# Patient Record
Sex: Male | Born: 1995 | Race: Black or African American | Hispanic: No | Marital: Single | Smoking: Never smoker
Health system: Southern US, Community
[De-identification: ages and names within clinical notes are randomized; demographics above are authoritative.]

## PROBLEM LIST (undated history)

## (undated) DIAGNOSIS — Z8619 Personal history of other infectious and parasitic diseases: Secondary | ICD-10-CM

## (undated) DIAGNOSIS — E0500 Thyrotoxicosis with diffuse goiter without thyrotoxic crisis or storm: Secondary | ICD-10-CM

## (undated) HISTORY — DX: Personal history of other infectious and parasitic diseases: Z86.19

---
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
## 2013-08-01 ENCOUNTER — Ambulatory Visit: Payer: Self-pay | Admitting: Family Medicine

## 2013-08-01 LAB — CBC AND DIFFERENTIAL
HCT: 41 % (ref 41–53)
HEMOGLOBIN: 14.5 g/dL (ref 13.5–17.5)
Platelets: 275 10*3/uL (ref 150–399)
WBC: 4.2 10^3/mL

## 2013-10-23 ENCOUNTER — Observation Stay: Payer: Self-pay | Admitting: Internal Medicine

## 2013-10-23 DIAGNOSIS — E0590 Thyrotoxicosis, unspecified without thyrotoxic crisis or storm: Secondary | ICD-10-CM | POA: Insufficient documentation

## 2013-10-23 LAB — T4, FREE: Free Thyroxine: >8 ng/dL — ABNORMAL HIGH (ref 0.76–1.46)

## 2013-10-23 LAB — COMPREHENSIVE METABOLIC PANEL
ALT: 62 U/L
Albumin: 3.5 g/dL (ref 3.4–5.0)
Alkaline Phosphatase: 173 U/L — ABNORMAL HIGH
Anion Gap: 10 (ref 7–16)
BUN: 9 mg/dL (ref 7–18)
Bilirubin,Total: 2.7 mg/dL — ABNORMAL HIGH (ref 0.2–1.0)
CHLORIDE: 107 mmol/L (ref 98–107)
CO2: 23 mmol/L (ref 21–32)
CREATININE: 0.56 mg/dL — AB (ref 0.60–1.30)
Calcium, Total: 9.1 mg/dL (ref 8.5–10.1)
Glucose: 82 mg/dL (ref 65–99)
Osmolality: 277 (ref 275–301)
POTASSIUM: 4.3 mmol/L (ref 3.5–5.1)
SGOT(AST): 46 U/L — ABNORMAL HIGH (ref 15–37)
SODIUM: 140 mmol/L (ref 136–145)
Total Protein: 6.4 g/dL (ref 6.4–8.2)

## 2013-10-23 LAB — URINALYSIS, COMPLETE
BACTERIA: NONE SEEN
BLOOD: NEGATIVE
Bilirubin,UR: NEGATIVE
Glucose,UR: NEGATIVE mg/dL (ref 0–75)
KETONE: NEGATIVE
Leukocyte Esterase: NEGATIVE
Nitrite: NEGATIVE
PROTEIN: NEGATIVE
Ph: 7 (ref 4.5–8.0)
RBC,UR: 1 /HPF (ref 0–5)
SPECIFIC GRAVITY: 1.01 (ref 1.003–1.030)

## 2013-10-23 LAB — CK-MB
CK-MB: 2.7 ng/mL (ref 0.5–3.6)
CK-MB: 2.5 ng/mL (ref 0.5–3.6)

## 2013-10-23 LAB — DRUG SCREEN, URINE

## 2013-10-23 LAB — CBC
HCT: 44.3 % (ref 40.0–52.0)
HGB: 14.4 g/dL (ref 13.0–18.0)
MCH: 27.1 pg (ref 26.0–34.0)
MCHC: 32.6 g/dL (ref 32.0–36.0)
MCV: 83 fL (ref 80–100)
PLATELETS: 242 10*3/uL (ref 150–440)
RBC: 5.33 10*6/uL (ref 4.40–5.90)
RDW: 13.4 % (ref 11.5–14.5)
WBC: 4.1 10*3/uL (ref 3.8–10.6)

## 2013-10-23 LAB — TSH

## 2013-10-23 LAB — TROPONIN I
TROPONIN-I: 0.12 ng/mL — AB
Troponin-I: 0.13 ng/mL — ABNORMAL HIGH

## 2013-10-24 DIAGNOSIS — I2589 Other forms of chronic ischemic heart disease: Secondary | ICD-10-CM

## 2013-10-24 DIAGNOSIS — I369 Nonrheumatic tricuspid valve disorder, unspecified: Secondary | ICD-10-CM

## 2013-10-24 LAB — LIPID PANEL
CHOLESTEROL: 77 mg/dL (ref 0–200)
HDL Cholesterol: 29 mg/dL — ABNORMAL LOW (ref 40–60)
Ldl Cholesterol, Calc: 39 mg/dL (ref 0–100)
Triglycerides: 44 mg/dL (ref 0–200)
VLDL Cholesterol, Calc: 9 mg/dL (ref 5–40)

## 2013-10-24 LAB — TROPONIN I: Troponin-I: 0.13 ng/mL — ABNORMAL HIGH

## 2013-10-24 LAB — CK-MB: CK-MB: 2 ng/mL (ref 0.5–3.6)

## 2013-10-25 LAB — CBC WITH DIFFERENTIAL/PLATELET
BASOS PCT: 0.4 %
Basophil #: 0 10*3/uL (ref 0.0–0.1)
EOS ABS: 0.2 10*3/uL (ref 0.0–0.7)
Eosinophil %: 3 %
HCT: 43.6 % (ref 40.0–52.0)
HGB: 14.3 g/dL (ref 13.0–18.0)
Lymphocyte #: 2.9 10*3/uL (ref 1.0–3.6)
Lymphocyte %: 54.7 %
MCH: 27.4 pg (ref 26.0–34.0)
MCHC: 32.8 g/dL (ref 32.0–36.0)
MCV: 84 fL (ref 80–100)
Monocyte #: 0.6 x10 3/mm (ref 0.2–1.0)
Monocyte %: 11.6 %
Neutrophil #: 1.6 10*3/uL (ref 1.4–6.5)
Neutrophil %: 30.3 %
Platelet: 239 10*3/uL (ref 150–440)
RBC: 5.23 10*6/uL (ref 4.40–5.90)
RDW: 13.6 % (ref 11.5–14.5)
WBC: 5.4 10*3/uL (ref 3.8–10.6)

## 2013-11-11 DIAGNOSIS — I428 Other cardiomyopathies: Secondary | ICD-10-CM | POA: Insufficient documentation

## 2014-05-27 NOTE — Consult Note (Signed)
PATIENT NAME:  Edwin Haney, Edwin Haney MR#:  [id number] DATE OF BIRTH:  Feb 14, 1995  DATE OF CONSULTATION:  10/24/2013  REFERRING PHYSICIAN:  Srikar R. Sudini, MD  CONSULTING PHYSICIAN:  A. Wendall Mola, MD  CHIEF COMPLAINT: Hyperthyroidism.   HISTORY OF PRESENT ILLNESS: This is a 19 year old male who was admitted on 10/23/2013 with complaints of concerns about his eyes. He reports 4 days prior to admission the left eyelid became difficult to raise, and 2 days prior to admission the right eye seemed to be protruding. This has been stable since onset. He has no prior past medical history. Initial labs showed TSH level was undetectable and free T4 was above the level of detection at greater than 8. Thyroid ultrasound showed a goiter with heterogeneous thyroid architecture and increased vascularity. The patient has been started on methimazole 10 mg every 8 hours and prednisone 100 mg daily in addition to metoprolol. He was noted to have tachycardia, and cardiology has been consulted. Echocardiogram showed reduced EF at 40%. The patient has noted a longstanding tremor, states this is unchanged. He gets very winded on prolonged exertion. Denies weight loss. Denies increased hunger. Denies neck pain. Denies heat intolerance. Denies unintentional weight loss.   PAST MEDICAL HISTORY: None.   PAST SURGICAL HISTORY: None.   SOCIAL HISTORY: The patient lives with his family. No alcohol use or drug use.   FAMILY HISTORY: Both maternal grandparents had some sort of thyroid condition, details not known. A maternal grandmother had type 2 diabetes mellitus. A paternal grandfather had bone cancer. A paternal aunt had lupus.   ALLERGIES: No known drug allergies.   OUTPATIENT MEDICATIONS: None.  CURRENT INPATIENT MEDICATIONS:  1.  Methimazole 10 mg t.i.d.  2.  Propranolol 20 mg t.i.d.  3.  Prednisone 100 mg daily.   REVIEW OF SYSTEMS:  GENERAL: No weight loss. No fever.  HEENT: Denies eye pain. Reports difficulty with  vision in left eye due to the closure of the left eyelid. Denies neck pain. Denies neck enlargement. Denies dysphagia or odynophagia.   CARDIAC: Denies palpitations. Denies chest pain.  PULMONARY: Reports dyspnea on exertion. Denies cough.  ABDOMEN: Denies abdominal pain. Denies nausea. Reports good appetite.  EXTREMITIES: Denies lower extremity swelling.  SKIN: Denies recent rash or skin changes.  ENDOCRINE: Denies heat or cold intolerance.  GENITOURINARY: Denies dysuria or hematuria.   PHYSICAL EXAMINATION:  VITAL SIGNS: Height 70 inches, weight 156 pounds, temperature 98.5, pulse 79, respirations 20, blood pressure 143/74, pulse oximetry 96% on room air.  GENERAL: Well-developed African American male in no acute distress.  HEENT: EOMI. Oropharynx is clear. Left lid lag and right eye moderate proptosis with stare is present. No periorbital edema. EOMI.  NECK: Supple. Pronounced thyromegaly. Thyroid bruit is present. No neck tenderness. No palpable thyroid nodules.  CARDIAC: Regular rate and rhythm. No murmur.  PULMONARY: Clear to auscultation bilaterally. No wheeze.  ABDOMEN: Diffusely soft, nontender, nondistended.  EXTREMITIES: No peripheral edema is present.  SKIN: Patches of hypopigmentation consistent with vitiligo present on the hands. No rash.  NEUROLOGIC: No tremor of outstretched hands. Cranial nerves intact.   LABORATORY DATA: Glucose 82, BUN 9, creatinine 0.56, sodium 140, potassium 4.3, calcium 9.1, albumin 3.5, alkaline phosphatase 173, AST 46, ALT 62, troponin I 0.13. Urine drug screen negative. Hematocrit 44.3, WBC 4.1, platelets 242,000.    ASSESSMENT: 1.  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  2.  Graves orbitopathy.  3.  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  4.  Tachycardia, resolved.   PLAN:  1.  Adjust dose of methimazole to 30 mg b.i.d.  2.  Prednisone can be somewhat helpful for Graves orbitopathy. Will adjust dose to 60 mg daily and  this is going to be needed long term, with plan for slow taper. 3.  Recommend outpatient followup with Graves eye specialist, which I can arrange.  4.  Continue propranolol for heart rate control. Current dose is adequate.  5.  Counseled patient about Graves hyperthyroidism and Graves orbitopathy, causes, natural history, and long-term treatment plan. Anticipate initial management with medications until hyperthyroidism is well controlled, and then subsequent management with either radioiodine ablation and/or surgery, to be determined at a later date.   Thank you for the kind request for consultation. From endocrine standpoint, no need for further hospitalization and I can assist with management as outpatient. Will arrange for clinic followup in 1-2 weeks.    ____________________________ A. Wendall Mola, MD ams:ST D: 10/24/2013 21:25:11 ET T: 10/24/2013 22:55:20 ET JOB#: [id number]  cc: A. Wendall Mola, MD, <Dictator> Macy Mis MD ELECTRONICALLY SIGNED 11/01/2013 8:37

## 2014-05-27 NOTE — H&P (Signed)
PATIENT NAME:  Edwin Haney, Edwin Haney MR#:  [id number] DATE OF BIRTH:  [birth year]  DATE OF ADMISSION:  10/23/2013  PRIMARY CARE PHYSICIAN: [facility].   CHIEF COMPLAINT: Swollen eyes with some blurred vision.  HISTORY OF PRESENTING ILLNESS: A 19 year old African American male patient with no past medical history who presents to the Emergency Room complaining of 2 days of swollen bilateral eyes and some drooping of the left eye. Patient mentions that the patient also has his mother accompanying him here in the Emergency Room today. They mentioned that they did not notice any change in his eyes until 2 days back. He had a normal physical examination with his primary care physician 2 months prior, with normal labs done.   Here, his TSH has been noticed to be less than 0.01 along with troponin of 0.13, and is being admitted to the hospitalist service. Patient's pulse is close to 100.   He does not complain of any swelling in his neck, trouble swallowing. No palpitations, diarrhea, weight loss. Does mention that he has been feeling a little anxious, some tremors, but has been going on with his regular life and activities.   PAST MEDICAL HISTORY: None.   SOCIAL HISTORY: The patient does not smoke. No alcohol. No illicit drugs. Presently he is in college.   FAMILY HISTORY: Bone cancer in his grandfather. Mother and father are healthy. No thyroid disease in the family.   HOME MEDICATIONS: Ibuprofen 1 tablet p.r.n. occasionally.   ALLERGIES: No known drug allergies.   REVIEW OF SYSTEMS:  CONSTITUTIONAL: Complains of no fever or weakness, complains of fatigue. EYES: Has some blurred vision, but no pain.  ENT: No tinnitus, ear pain, hearing loss.  RESPIRATORY: No cough, wheeze, hemoptysis. CARDIOVASCULAR: No chest pain, orthopnea, edema.  GASTROINTESTINAL: No nausea, vomiting, diarrhea, abdominal pain.  GENITOURINARY: No dysuria, hematuria, frequency.  ENDOCRINE: No polyuria, nocturia,  thyroid problems. HEMATOLOGY AND LYMPHATIC: No anemia, easy bruising, bleeding.  INTEGUMENTARY: No acne, rash, lesion.  MUSCULOSKELETAL: No back pain, arthritis.  NEUROLOGIC: No focal numbness, weakness, seizure.  PSYCHIATRIC: No anxiety or depression   PHYSICAL EXAMINATION: Shows:  VITAL SIGNS: Temperature 98.2, pulse of 98, blood pressure 137/79, saturating 97% on room air. GENERAL: A thin African American male patient sitting at the side of the bed, seems comfortable, conversational, seems anxious. PSYCHIATRIC: Alert and oriented x 3, anxious.  HEENT: (Dictation Anomaly). Oral mucosa moist and pink. External ears and nose normal. Has swollen eyes, exophthalmos with some conjunctival injection. Has ptosis of the left eye.  NECK: Supple. Has a diffuse swollen thyroid gland with no pain, warmth. No carotid bruit, JVD.  CARDIOVASCULAR: S1, S2, tachycardic without any murmurs. Peripheral pulses 2+. Has hyperdynamic circulation.  RESPIRATORY: Normal work of breathing. Clear to auscultation (Dictation Anomaly). GASTROINTESTINAL: Soft abdomen, nontender. Bowel sounds present. No hepatosplenomegaly palpable.  SKIN: Warm and dry. No petechiae, rash, ulcers.  MUSCULOSKELETAL: No joint tenderness in large joints. Normal muscle tone. NEUROLOGICAL: Motor strength 5/5 in upper extremities. Sensory (Dictation Anomaly).  LYMPHATIC: No cervical lymphadenopathy.   LABORATORY STUDIES: Show glucose of 82, BUN 9, creatinine 0.56, sodium 140, potassium 4.3, with GFR greater than 60. AST, ALT normal. Alkaline phosphatase of 173 with bilirubin 2.7. Troponin 0.13 with TSH of less than 0.01. Urine drug screen negative.   WBC (Dictation Anomaly), hemoglobin 14.4, platelets of 242,000.  Urinalysis shows no bacteria, 1 WBC.   EKG shows normal sinus rhythm with T-wave inversions in V3, V4 along with nonspecific ST-T wave changes.  No ST elevation found.   CT of the head without contrast shows nothing acute.    Chest x-ray, PA and lateral, showed no acute cardiopulmonary disease.   Ultrasound of the neck shows thyroid gland enlarged diffusely with increased vascularity. Ultrasound of the right upper quadrant is negative, nothing acute.   ASSESSMENT AND PLAN: 1.  Hyperthyroidism with TSH extremely low. The patient does have exophthalmos and a diffusely swollen thyroid gland. Considering his symptoms of exophthalmos, this seems to be more of a chronic condition, likely Graves disease. Ordered thyroid antibodies. Ultrasound has already been done. We will start him on propranolol along with methimazole and prednisone. Consult endocrinology for further input with the case. The patient does say he gets blurred vision, but only when he turns suddenly. Will need to closely monitor his vision at this point. Will likely need surgery in the future, and  endocrine followup. 2.  Elevated troponin, likely secondary from cardiomyopathy or his sinus tachycardia. He does not complain of any chest pain, shortness of breath. EKG does have changes, but no ST elevation found. Will get an echocardiogram along with consult cardiology for further input with this case. The patient will be started on an aspirin. Check fasting lipid profile. He is also being started on a beta blocker.  3.  Hyperbilirubinemia. The patient's right upper quadrant ultrasound is normal. This is likely secondary to his severe hyperthyroidism.  4.  Deep vein thrombosis prophylaxis with ambulation.   CODE STATUS: Full code.  TIME SPENT ON THIS CASE: 40 minutes.   ____________________________ Molinda Bailiff Zakariyah Freimark, MD srs:ST D: 10/23/2013 21:18:29 ET T: 10/23/2013 21:35:41 ET JOB#: [id number]  cc: Wardell Heath R. Emalie Mcwethy, MD, <Dictator> [facility] A. Wendall Mola, MD Wardell Heath [place] MD ELECTRONICALLY SIGNED 10/26/2013 16:30

## 2014-05-27 NOTE — Discharge Summary (Signed)
Dates of Admission and Diagnosis:  Date of Admission 23-Oct-2013   Date of Discharge 25-Oct-2013   Admitting Diagnosis Hyperthyroidism with TSH extremely low   Final Diagnosis 1.  Hyperthyroidism due to graves dz and orbitopathy 2.  Elevated troponin: due to supply demand ischemia from tachycardia due to graves  3.  Hyperbilirubinemia: likely secondary to his severe hyperthyroidism    Chief Complaint/History of Present Illness Swollen eyes with some blurred vision   Allergies:  No Known Allergies:   Pertinent Past History:  Pertinent Past History none   Hospital Course:  Hospital Course patient is a 19 year old male with no significant medical problem was admitted for hyperthyroidism with graves ophthalmopathy. he was started on methimazole prednisone and propranolol. Recently Dr. Eddie North dictated history and physical for further details. endocrine consultation was obtained with Dr. Tedd Sias. She agreed with above management and recommended outpatient followup.patient was feeling much better and was discharged home in stable condition. He was agreeable to discharge plans.   Condition on Discharge Good   Code Status:  Code Status Full Code   PHYSICAL EXAM ON DISCHARGE:  Physical Exam:  GEN no acute distress   HEENT pink conjunctivae   NECK supple  No masses   RESP normal resp effort  clear BS   CARD regular rate  no murmur   VASCULAR ACCESS -- Purulent drainage   ABD denies tenderness  soft   EXTR negative edema   SKIN No ulcers   NEURO cranial nerves intact, follows commands, motor/sensory function intact   PSYCH alert, A+O to time, place, person   DISCHARGE INSTRUCTIONS HOME MEDS:  Medication Reconciliation: Patient's Home Medications at Discharge:     Medication Instructions  proventil hfa cfc free 90 mcg/inh inhalation aerosol  1 puff(s) inhaled 4 times a day, As Needed - for Shortness of Breath, for Wheezing    prednisone 20 mg oral tablet  3 tab(s)  orally once a day x 21 days   methimazole 10 mg oral tablet  3 tab(s) orally 2 times a day x 30 days   propranolol 20 mg oral tablet  1 tab(s) orally 3 times a day    PRESCRIPTIONS: ELECTRONICALLY SUBMITTED   Physician's Instructions:  Diet Low Sodium  Low Fat, Low Cholesterol   Activity Limitations As tolerated   Return to Work Not Applicable   Time frame for Follow Up Appointment 1-2 weeks  dr Tobi Bastos solum   Time frame for Follow Up Appointment 2-4 weeks  [facility]     Kirke Corin, Muhammad(Consultant): [facility], [address], [address], [place], [place] [address], [place] [phone]   Carlena Sax M(Consultant): [facility], [address], [place], [place] [address], [place] [phone]   Miscellaneous, Doctor(Family Physician): [address] [place], [place], [place] [address], [place] [phone]  TIME SPENT:  Total Time: Greater than 30 minutes   Electronic Signatures: Patricia Pesa (MD)  (Signed 23-Sep-15 11:44)  Authored: ADMISSION DATE AND DIAGNOSIS, CHIEF COMPLAINT/HPI, Allergies, PERTINENT PAST HISTORY, HOSPITAL COURSE, PHYSICAL EXAM ON DISCHARGE, DISCHARGE INSTRUCTIONS HOME MEDS, PATIENT INSTRUCTIONS, Follow Up Physician, TIME SPENT   Last Updated: 23-Sep-15 11:44 by Patricia Pesa (MD)

## 2014-05-27 NOTE — Consult Note (Signed)
General Aspect Primary MD: [facility] Primary Cardiologist: New to [facility]  19 year old male with no known prior PMH who presented to [facility] 10/23/13 with bilateral swollen eyes and drooping of the left eye.  __________________________________  Past Medical History: 1. None __________________________________   Present Illness 19 year old male with no known prior PMH who presented to [facility] 10/23/13 with bilateral swollen eyes and drooping of the left eye x 2 days. Patient notes increased fatigue, increased sweating, tremors, and palpitations with exertion over the past 6 months, however it was the swollen right eye and left eye drooping that brought him into the hospital. He denies any chest pain through out all of the above. Prior to his arrival to the hospital he applied alcohol to the left eye thinking it was a bug bite. He denies any changes to his bowels. He recently had a full CPE with his PCP about 2 months ago that was reportedly normal with normal labs. He has been going on with his regular life throughout the above.    Upon arrival to [facility] ED he was found he was found to be in NSR with a pulse close to 100. TSH was found to be <0.010 with a free T4 of >8. Troponin 0.13 that has further plateaued to 0.12-->0.13. EKG NSR, 72, TWI V3, V4, nonspecific st/t changes. Echo has been performed and is pending. He was admitted to the hospitalist service and started on methimazole, propranolol, and prednisone. Endocrinology consult is pending. We are consulted for further evaluation.   Physical Exam:  GEN well developed, well nourished, no acute distress   HEENT PERRL, hearing intact to voice   NECK supple  bruit left side   RESP normal resp effort  clear BS   CARD Regular rate and rhythm  No murmur   ABD denies tenderness  rigid  normal BS   EXTR negative edema   SKIN normal to palpation   NEURO cranial nerves intact   PSYCH alert, A+O to time, place, person, good insight    Review of Systems:  General: Fatigue   ENT: No Complaints   Eyes: Vision difficulty  exophthalmos and swollen eye   Neck: No Complaints   Respiratory: No Complaints   Cardiovascular: Palpitations   Gastrointestinal: No Complaints   Genitourinary: No Complaints   Vascular: No Complaints   Musculoskeletal: No Complaints   Neurologic: tremors   Psychiatric: Nervousness  Anxiety   Review of Systems: All other systems were reviewed and found to be negative   Medications/Allergies Reviewed Medications/Allergies reviewed   Family & Social History:  Family and Social History:  Family History father with bone cancer. grandmother & aunt with thyroid issues (unclear if hypo or hyper)   Social History negative tobacco, negative ETOH, negative Illicit drugs   Place of Living Home  lives in [place]     denies:    None, patient reports no surgical history.:   Home Medications: Medication Instructions Status  Proventil HFA CFC free 90 mcg/inh inhalation aerosol 1 puff(s) inhaled 4 times a day, As Needed - for Shortness of Breath, for Wheezing  Active   Lab Results:  Thyroid:  20-Sep-15 15:16   Thyroid Stimulating Hormone  < 0.010 (0.45-4.50 (IU = International Unit)  ----------------------- Pregnant patients have  different reference  ranges for TSH:  - - - - - - - - - -  Pregnant, first trimetser:  0.36 - 2.50 uIU/mL)    19:18   Thyroxine, Free  >  8.00 (Result(s) reported on 23 Oct 2013 at 10:25PM.)  Hepatic:  20-Sep-15 15:16   Bilirubin, Total  2.7  Alkaline Phosphatase  173 (46-116 NOTE: New Reference Range 08/23/13)  SGPT (ALT) 62 (14-63 NOTE: New Reference Range 08/23/13)  SGOT (AST)  46  Total Protein, Serum 6.4  Albumin, Serum 3.5  General Ref:  20-Sep-15 15:16   T3 Uptake ========== TEST NAME ==========  ========= RESULTS =========  = REFERENCE RANGE =  T3 UPTAKE  T3 Uptake T3 Uptake                       [H  50 %                 ]              24-39 Free Thyroxine Index            [H  15.6                 ]     1.2-4.9               [facility]            No: [id number]           [address], [place], [place] [address]           Lindon Romp, MD         [phone]   Result(s) reported on 24 Oct 2013 at 05:47AM.  Thyroxine (T4) ========== TEST NAME ==========  ========= RESULTS =========  = REFERENCE RANGE =  THYROXINE(T4)  Thyroxine (T4) Thyroxine (T4)                  [H  31.2 ug/dL           ]          4.5-12.0 Results confirmed on dilution.               [facility]            No: [id number]           [address] [place], [place] [address]           Lindon Romp, MD         [phone]   Result(s) reported on 24 Oct 2013 at 05:47AM.  Routine Chem:  20-Sep-15 15:16   Result Comment TROPONIN - RESULTS VERIFIED BY REPEAT TESTING.  - LAB/ERIC KOMAR-PA AT 1817 10/23/13  - READ-BACK PROCESS PERFORMED.  Result(s) reported on 23 Oct 2013 at 06:20PM.  Glucose, Serum 82  BUN 9  Creatinine (comp)  0.56  Sodium, Serum 140  Potassium, Serum 4.3  Chloride, Serum 107  CO2, Serum 23  Calcium (Total), Serum 9.1  Osmolality (calc) 277  eGFR (African American) >60  eGFR (Non-African American) >60 (eGFR values <61m/min/1.73 m2 may be an indication of chronic kidney disease (CKD). Calculated eGFR is useful in patients with stable renal function. The eGFR calculation will not be reliable in acutely ill patients when serum creatinine is changing rapidly. It is not useful in  patients on dialysis. The eGFR calculation may not be applicable to patients at the low and high extremes of body sizes, pregnant women, and vegetarians.)  Anion Gap 10  Urine Drugs:  [id number]  Tricyclic Antidepressant, Ur Qual (comp) NEGATIVE (Result(s) reported on 23 Oct 2013 at 03:43PM.)  Amphetamines, Urine Qual. NEGATIVE  MDMA, Urine Qual. NEGATIVE  Cocaine Metabolite, Urine Qual. NEGATIVE   Opiate, Urine qual NEGATIVE  Phencyclidine, Urine Qual. NEGATIVE  Cannabinoid, Urine Qual. NEGATIVE  Barbiturates, Urine Qual. NEGATIVE  Benzodiazepine, Urine Qual. NEGATIVE (----------------- The URINE DRUG SCREEN provides only a preliminary, unconfirmed analytical test result and should not be used for non-medical  purposes.  Clinical consideration and professional judgment should be  applied to any positive drug screen result due to possible interfering substances.  A more specific alternate chemical method must be used in order to obtain a confirmed analytical result.  Gas chromatography/mass spectrometry (GC/MS) is the preferred confirmatory method.)  Methadone, Urine Qual. NEGATIVE  Cardiac:  20-Sep-15 15:16   CPK-MB, Serum 2.7 (Result(s) reported on 23 Oct 2013 at 11:22PM.)  Troponin I  0.13 (0.00-0.05 0.05 ng/mL or less: NEGATIVE  Repeat testing in 3-6 hrs  if clinically indicated. >0.05 ng/mL: POTENTIAL  MYOCARDIAL INJURY. Repeat  testing in 3-6 hrs if  clinically indicated. NOTE: An increase or decrease  of 30% or more on serial  testing suggests a  clinically important change)    19:18   Troponin I  0.12 (0.00-0.05 0.05 ng/mL or less: NEGATIVE  Repeat testing in 3-6 hrs  if clinically indicated. >0.05 ng/mL: POTENTIAL  MYOCARDIAL INJURY. Repeat  testing in 3-6 hrs if  clinically indicated. NOTE: An increase or decrease  of 30% or more on serial  testing suggests a  clinically important change)  21-Sep-15 23:55   Troponin I  0.13 (0.00-0.05 0.05 ng/mL or less: NEGATIVE  Repeat testing in 3-6 hrs  if clinically indicated. >0.05 ng/mL: POTENTIAL  MYOCARDIAL INJURY. Repeat  testing in 3-6 hrs if  clinically indicated. NOTE: An increase or decrease  of 30% or more on serial  testing suggests a  clinically important change)  Routine UA:  20-Sep-15 15:16   Color (UA) Yellow  Clarity (UA) Hazy  Glucose (UA) Negative  Bilirubin (UA) Negative  Ketones  (UA) Negative  Specific Gravity (UA) 1.010  Blood (UA) Negative  pH (UA) 7.0  Protein (UA) Negative  Nitrite (UA) Negative  Leukocyte Esterase (UA) Negative (Result(s) reported on 23 Oct 2013 at 03:51PM.)  RBC (UA) 1 /HPF  WBC (UA) 1 /HPF  Bacteria (UA) NONE SEEN  Epithelial Cells (UA) <1 /HPF  Mucous (UA) PRESENT (Result(s) reported on 23 Oct 2013 at 03:51PM.)  Routine Hem:  20-Sep-15 15:16   WBC (CBC) 4.1  RBC (CBC) 5.33  Hemoglobin (CBC) 14.4  Hematocrit (CBC) 44.3  Platelet Count (CBC) 242 (Result(s) reported on 23 Oct 2013 at 03:28PM.)  MCV 83  MCH 27.1  MCHC 32.6  RDW 13.4   EKG:  EKG Interp. by me   Interpretation NSR, 72, TWI V3, V4, nonspecific st/t changes   Radiology Results: [place]:    20-Sep-15 17:32, US Soft Tissue Head/Neck/Thyroid  US Soft Tissue Head/Neck/Thyroid   REASON FOR EXAM:    neck swelling  COMMENTS:       PROCEDURE: [place]  - US SOFT TISSUE HEAD/NECK/THYROID  - Oct 23 2013  5:32PM     CLINICAL DATA:  Neck swelling.    EXAM:  THYROID ULTRASOUND    TECHNIQUE:  Ultrasound examination of the thyroid gland andadjacent soft  tissues was performed.    COMPARISON:  None.  FINDINGS:  Right thyroid lobe    Measurements: 7 cm x 2.6 cm x 2.9 cm. No nodules. Gland is diffusely  heterogeneous and demonstrates diffuse increased vascularity.    Left thyroid lobe    Measurements: 6.8 cm  x 2.4 cm x 3.2 cm. No nodules. Gland is  diffusely heterogeneous and demonstrates diffuse increased  vascularity.    Isthmus    Thickness: 9.2 mm.  No nodules visualized.  Lymphadenopathy    None visualized.     IMPRESSION:  1. Thyroid gland is enlarged, diffusely heterogeneous echogenicity  and shows diffuse increased vascularity. This supports thyroiditis  in the proper clinical setting.  2. No discrete thyroid nodule.      Electronically Signed    By: Lajean Manes M.D.    On: 10/23/2013 17:38     Verified By: Lasandra Beech, M.D.,    20-Sep-15  20:56, US Abdomen Limited Survey  US Abdomen Limited Survey   REASON FOR EXAM:    Hyperbilirubinemia  COMMENTS:   Body Site: Gallbladder, Liver, Common Bile Duct    PROCEDURE: [place]  - US ABDOMEN LIMITED SURVEY  - Oct 23 2013  8:56PM     CLINICAL DATA:  Hyper bilirubinemia. Initial encounter.    EXAM:  US ABDOMEN LIMITED - RIGHT UPPER QUADRANT    COMPARISON:  05/13/2013    FINDINGS:  Gallbladder:  No gallstones or wall thickening visualized. No sonographic Murphy  sign noted.    Common bile duct:    Diameter: 3 mm.  Where visualized, no filling defect.    Liver:    No focal lesion identified. Within normal limits in parenchymal  echogenicity. Antegrade flow in the imaged portal venous system.     IMPRESSION:  Negative right upper quadrant ultrasound.  Electronically Signed    By: Jorje Guild M.D.    On: 10/23/2013 21:01         Verified By: Gilford Silvius, M.D.,    No Known Allergies:   Vital Signs/Nurse's Notes: **Vital Signs.:   21-Sep-15 08:39  Vital Signs Type Routine  Temperature Temperature (F) 97.8  Celsius 36.5  Temperature Source oral  Pulse Pulse 100  Respirations Respirations 20  Systolic BP Systolic BP 865  Diastolic BP (mmHg) Diastolic BP (mmHg) 73  Mean BP 94  Pulse Ox % Pulse Ox % 96  Pulse Ox Activity Level  At rest  Oxygen Delivery Room Air/ 21 %    Impression 19 year old male with no known prior PMH who presented to [facility] 10/23/13 with bilateral swollen eyes and drooping of the left eye who was found to have a TSH of <0.010, free T4 >8, an elevated troponin of 0.13-->0.12-->0.13, and sinus tachycardia.  1. Demand ischemia: -Troponin levels have plateaued -Unlikely ischemic as he does not have any chest pain or specific EKG changes -Echo pending - may reveal dilated cardiomyapthy 2/2 hyperthyroidism - tx is key  2. Hyperthyroidism: -Has been started on methimazole, propranolol, and prednisone. Endocrinology consult and further labs  pending. -Appears to be chronic condition  3. Hyperbilirubinemia: -Per IM   Electronic Signatures for Addendum Section:  Kathlyn Sacramento (MD) (Signed Addendum 21-Sep-15 13:33)  The patient was seen and examined. Agree with the above. He presented with left eye droop and exophthalamus with dyspnea with minimal activities. He was found to have severe hyperthyroidism. Mildly elevated TnI. exam shows hyperdynamic apex but no murmurs.  echos showed normal EF with mildly thickened mitral valve, mild TR and mild pulmonary hypertension.  Agree with propranolol and treatment of hyperthyroidim. Elevated TnI is likely due to supply demand ischemia.   Electronic Signatures: Kathlyn Sacramento (MD)  (Signed 21-Sep-15 13:33)  Co-Signer: General Aspect/Present Illness, Home Medications, Allergies, Impression/Plan Idolina Primer, Brenlyn Beshara M (PA-C)  (  Signed 21-Sep-15 10:35)  Authored: General Aspect/Present Illness, History and Physical Exam, Review of System, Family & Social History, Past Medical History, Home Medications, Labs, EKG , Radiology, Allergies, Vital Signs/Nurse's Notes, Impression/Plan   Last Updated: 21-Sep-15 13:33 by Kathlyn Sacramento (MD)

## 2014-05-28 NOTE — H&P (Signed)
PATIENT NAME:  Edwin Haney, Edwin Haney MR#:  [id number] DATE OF BIRTH:  Dec 22, 1995  DATE OF ADMISSION:  05/14/2011  PRIMARY MD: [facility]   ED REFERRING PHYSICIAN: Dr. Olivia Mackie    CHIEF COMPLAINT: Abdominal pain, diffuse.   HISTORY OF PRESENT ILLNESS: The patient is an 19 year old African American male with no past medical history who reports that he started having generalized abdominal pain which he describes as "my stomach is about to explode" since 2 a.m. this morning. Due to these symptoms, he came to the ED. He reports that it's a sharp type of pain. Sometimes it's cramping. It comes and goes. He is very hungry right now and wants to eat. He has waiting a few hours in the ED and is getting anxious about not eating. The patient in the ER was noted to have abnormal blood work including a creatinine that was elevated at 2.39. He was also noticed to have elevated bilirubin and slightly elevated. LFTs. His urinalysis also showed 1+ blood and 100 protein. We are asked to admit the patient. The patient reports that he was nauseous earlier and threw up his lunch but has not had any further episodes of vomiting. No diarrhea. He does report that he was working out yesterday as part of his track meet and did not get to drink enough water. He, otherwise, denies any fevers, chills. No hematemesis. No hematochezia. Denies any urinary frequency, urgency, or burning. Denies any flank pain or groin pain.   PAST MEDICAL HISTORY: None.   PAST SURGICAL HISTORY: None.   ALLERGIES: None.   MEDICATIONS: None.   SOCIAL HISTORY: Does not smoke. Does not drink. No drugs.   FAMILY HISTORY: His grandfather had liver cirrhosis but no other significant medical history.   REVIEW OF SYSTEMS: CONSTITUTIONAL: Denies any fevers. Complains of fatigue, weakness, abdominal pain. No weight loss. No weight gain. EYES: No blurred or double vision. No pain. No redness. No inflammation. No glaucoma. ENT: No tinnitus or ear  pain. No hearing loss. No allergies. No nasal discharge. No snoring. No postnasal drip. RESPIRATORY: No cough. No wheezing. No hemoptysis. No dyspnea. No COPD. No TB. CARDIOVASCULAR: No chest pain. No orthopnea. No edema. No arrhythmia. GI: Complains of nausea and one episode of vomiting. No diarrhea. Abdominal pain as above. No hematemesis. No melena. No ulcer. No gastroesophageal reflux disease. No IBS. No jaundice. No rectal bleeding. No changes in bowels. GU: Denies any dysuria, hematuria, renal calculus, or frequency. ENDOCRINE: Denies any polyuria, nocturia, or thyroid problems. HEME/LYMPH: Denies anemia, easy bruisability, or bleeding. SKIN: No acne. No rash. No changes in mole, hair or skin. MUSCULOSKELETAL: Denies any pain in neck, back, or shoulder. NEUROLOGIC: No numbness. No CVA. No TIA. PSYCHIATRIC: No anxiety. No insomnia. No ADD. No OCD. No bipolar.   PHYSICAL EXAMINATION:   VITAL SIGNS: Temperature 98.1, pulse 48, respirations 18, blood pressure 135/72, O2 98% on room air.   GENERAL: The patient is a well developed, well nourished [place] American male in no acute distress.   HEENT: Head atraumatic, normocephalic. Pupils equally round, reactive to light and accommodation. Extraocular movements intact. There is no conjunctival pallor. No scleral icterus. Oropharynx is a little dry. There are no exudates. Ear exam shows no external erythema or drainage. Nasal exam shows no drainage or ulceration.   NECK: No carotid bruits. No thyromegaly.   CARDIOVASCULAR: Regular rate and rhythm. No murmurs, rubs, clicks, or gallops. PMI is not displaced.   LUNGS: Clear to auscultation bilaterally  without any rales, rhonchi, or wheezing.   ABDOMEN: Mild epigastric tenderness. No guarding or rebound. No hepatosplenomegaly.   EXTREMITIES: No clubbing, cyanosis, or edema.   SKIN: No rash.   LYMPHATICS: No lymph nodes palpable.   MUSCULOSKELETAL: There is no erythema or swelling.   VASCULAR: Good  DP, PT pulses.   PSYCHIATRIC: Not anxious or depressed.   EVALUATIONS IN THE ED: Ultrasound of the abdomen shows no gallstones. No other acute changes. Kidneys appear mildly hyperechogenic, may be of technical origin.   WBC 8.4, hemoglobin 16.4, platelet count 208, glucose 87, BUN 15, creatinine 2.39, sodium 140, potassium 4.0, chloride 107, CO2 25. LFTs showed a bilirubin total of 2.8, alkaline phosphatase 85, ALT 28, AST 62, total protein 7.8, albumin 4.7, lipase 66. Urinalysis shows 1+ blood, nitrites negative, protein 100.   ASSESSMENT AND PLAN: The patient is an 19 year old African American male who presents with abdominal pain since this morning. He came to the ED and had a general ultrasound of the abdomen which is noted, noted to have elevated creatinine, elevated bilirubin. 1. Abdominal pain possibly due to gastritis, however, there are other things like appendicitis that needs to be ruled out. At this time will get a CT scan of the abdomen with p.o. contrast. Will have GI evaluate the patient. Place him on PPIs b.i.d.  2. Elevated creatinine with abnormal urinalysis suggesting possible renal disease. There is no previous BMP available. I called his primary care MD's office. It could be related to his track meeting yesterday with heavy exertion. At this time will give him IV fluids. Will check urine creatinine and protein. Will ask Nephrology for further evaluation.  3. Abnormal LFTs. Will check a hepatitis panel. His abdominal ultrasound shows no gallbladder disease.   TIME SPENT: 35 minutes.   ____________________________ Lacie Scotts Allena Katz, MD shp:drc D: 05/14/2011 16:35:35 ET T: 05/14/2011 17:07:15 ET JOB#: [id number]  cc: Chinaza Rooke H. Allena Katz, MD, <Dictator>, [facility] Charise Carwin MD ELECTRONICALLY SIGNED 05/17/2011 16:33

## 2014-05-28 NOTE — Consult Note (Signed)
PATIENT NAME:  Edwin Haney, BUB MR#:  [id number] DATE OF BIRTH:  [birth year]  DATE OF CONSULTATION:  05/15/2011  REFERRING PHYSICIAN:   CONSULTING PHYSICIAN:  Lurline Del, MD  REASON FOR CONSULTATION: Abdominal pain.   HISTORY OF PRESENT ILLNESS: The patient is an 19 year old African American male without significant past medical history. He was admitted through the Emergency Room by hospitalist service after he presented with severe abdominal pain mainly in the epigastric region. He vomited once. He was noted to have a high creatinine as well as high bilirubin of 2.5. The patient has no prior history of liver or kidney disease. The patient was involved in severe exertion on track prior to arrival and apparently did not hydrate himself very well according to him. CPK was elevated to about 1900 and patient was admitted with a diagnosis of rhabdomyolysis with renal insufficiency as well as abdominal pain, vomiting, and abnormal liver enzymes. As of today, the patient denies any abdominal pain. He has been tolerating a regular diet well. He has had no further nausea, vomiting, or diarrhea.   PAST MEDICAL HISTORY: None.   PAST SURGICAL HISTORY: None.   MEDICATIONS: None.   ALLERGIES: None.   SOCIAL HISTORY: Does not smoke or drink.   FAMILY HISTORY: Unremarkable.   REVIEW OF SYSTEMS: Negative except for what is mentioned in the history of present illness.   PHYSICAL EXAMINATION:   GENERAL: Well built male who does not appear to be in any acute distress.   VITAL SIGNS: Temperature 95.2, pulse 50, respirations 18 to 20, blood pressure 127/74.   HEENT: Unremarkable. Clinically he does not appear to be jaundiced.   NECK: Neck veins are flat.   LUNGS: Clear to auscultation bilaterally with fair air entry and no added sounds.   CARDIOVASCULAR: Regular rate and rhythm. No gallops or murmur.   ABDOMEN: Soft and benign, nontender, nondistended. No rebound or guarding was noted.    NEUROLOGIC: Appears to be unremarkable.   LABORATORY, DIAGNOSTIC, AND RADIOLOGICAL DATA: CBC is within normal limits. CPK was 1900 on admission; is 841 today. Lipase is normal at 66. Liver enzymes are normal except for bilirubin which is somewhat elevated at 2.8 and AST of 62. CT scan of the abdomen and pelvis as well as ultrasound is unremarkable.   ASSESSMENT AND PLAN: The patient is with acute abdominal pain most likely secondary to rhabdomyolysis. The patient's abdominal pain has resolved and CT and ultrasound are unremarkable. The patient also had elevated bilirubin with somewhat elevated AST. This is also consistent with rhabdomyolysis and dehydration. Underlying Guillain-Barre syndrome with further worsening of bilirubin secondary to dehydration is another possibility. No signs of active liver disease and CT and ultrasound are fairly unremarkable.   RECOMMENDATIONS:  1. I would recommend repeating LFTs tomorrow morning.  2. Agree with regular diet.  3. If the patient's bilirubin improves and he continues to be asymptomatic, no further GI work-up will be done.  4. Will follow.   ____________________________ Lurline Del, MD si:drc D: 05/15/2011 18:14:00 ET T: 05/16/2011 08:36:28 ET JOB#: [id number]  cc: Lurline Del, MD, <Dictator> Lurline Del MD ELECTRONICALLY SIGNED 05/16/2011 16:34

## 2014-05-28 NOTE — Discharge Summary (Signed)
PATIENT NAME:  Edwin Haney, Edwin Haney MR#:  [id number] DATE OF BIRTH:  09-16-1995  DATE OF ADMISSION:  05/14/2011 DATE OF DISCHARGE:  05/16/2011  PRESENTING COMPLAINT:  Abdominal pain and vomiting.  DISCHARGE DIAGNOSES:  1. Acute rhabdomyolysis.  2. Severe dehydration.  3. Acute renal failure.   CONDITION ON DISCHARGE: Fair.   MEDICATIONS: Claritin 10 mg p.o. daily.   CONSULTATIONS: Dr. Thedore Mins, nephrology.   FOLLOWUP:  Follow up with Dr. Wynelle Link on 05/19/2011 at 11:00 a.m.   LABS AT DISCHARGE:  CK total 531, creatinine 1.8, BUN 12, sodium 146, potassium 3.5, chloride 107, bicarbonate is 29, calcium is 8.1. Urine creatinine 106.2.  Random urine protein is 13. Urinalysis negative for urinary tract infection. CBC within normal limits. Creatinine on admission was 2.4.   BRIEF SUMMARY OF HOSPITAL COURSE:  Edwin Haney is an 19 year old African American male with no medical history who comes to the Emergency Room with:  1. Acute rhabdomyolysis suspected from not hydrating well after a track meet and dehydration. The patient received IV fluids with normal saline and bicarbonate drip, which was ordered by Dr. Thedore Mins. Urinalysis suggestive of rhabdomyolysis. Creatinine was 2.4, which is down to 1.8 after IV hydration. The patient was hydrating well and appeared euvolemic at discharge.  2. Abdominal pain from vomiting and dehydration, improved. Tolerating regular food.  3. Acute renal failure from dehydration due to rhabdomyolysis. No baseline labs were found. The patient's creatinine trended down. He will follow up with Dr. Wynelle Link from nephrology as an outpatient to ensure normalizing of creatinine.  4. Hospital stay otherwise remained stable. The patient remained a FULL CODE.    Discharge plan was discussed with the patient's parents.   TIME SPENT: 40 minutes.   ____________________________ Wylie Hail Allena Katz, MD sap:bjt D: 05/16/2011 16:36:05 ET T: 05/19/2011 09:16:25 ET JOB#: [id number]  cc: Suellyn Meenan A. Allena Katz,  MD, <Dictator> Laverda Sorenson, MD Willow Ora MD ELECTRONICALLY SIGNED 05/26/2011 13:20

## 2014-08-02 ENCOUNTER — Other Ambulatory Visit: Payer: Self-pay | Admitting: Internal Medicine

## 2014-08-02 DIAGNOSIS — E0500 Thyrotoxicosis with diffuse goiter without thyrotoxic crisis or storm: Secondary | ICD-10-CM

## 2014-08-31 ENCOUNTER — Encounter
Admission: RE | Admit: 2014-08-31 | Discharge: 2014-08-31 | Disposition: A | Discharge status: Home / Self Care | Payer: PRIVATE HEALTH INSURANCE | Source: Ambulatory Visit | Attending: Internal Medicine | Admitting: Internal Medicine

## 2014-08-31 DIAGNOSIS — E0500 Thyrotoxicosis with diffuse goiter without thyrotoxic crisis or storm: Secondary | ICD-10-CM | POA: Insufficient documentation

## 2014-08-31 HISTORY — DX: Thyrotoxicosis with diffuse goiter without thyrotoxic crisis or storm: E05.00

## 2014-08-31 MED ORDER — SODIUM IODIDE I-123 7.4 MBQ PO CAPS
159.1890 | ORAL_CAPSULE | Freq: Once | ORAL | Status: AC
  Administered 2014-08-31: 159.189 via ORAL

## 2014-09-01 ENCOUNTER — Encounter
Admission: RE | Admit: 2014-09-01 | Discharge: 2014-09-01 | Disposition: A | Discharge status: Home / Self Care | Payer: PRIVATE HEALTH INSURANCE | Source: Ambulatory Visit | Attending: Internal Medicine | Admitting: Internal Medicine

## 2014-09-01 DIAGNOSIS — E0500 Thyrotoxicosis with diffuse goiter without thyrotoxic crisis or storm: Secondary | ICD-10-CM | POA: Diagnosis not present

## 2014-09-07 ENCOUNTER — Ambulatory Visit
Admission: RE | Admit: 2014-09-07 | Discharge: 2014-09-07 | Disposition: A | Discharge status: Home / Self Care | Payer: PRIVATE HEALTH INSURANCE | Source: Ambulatory Visit | Attending: Internal Medicine | Admitting: Internal Medicine

## 2014-09-07 DIAGNOSIS — E0500 Thyrotoxicosis with diffuse goiter without thyrotoxic crisis or storm: Secondary | ICD-10-CM | POA: Diagnosis present

## 2014-09-07 MED ORDER — SODIUM IODIDE I 131 CAPSULE
18.9200 | Freq: Once | INTRAVENOUS | Status: AC | PRN
  Administered 2014-09-07: 18.92 via ORAL

## 2015-04-30 ENCOUNTER — Encounter: Payer: Self-pay | Admitting: Medical Oncology

## 2015-04-30 ENCOUNTER — Emergency Department
Admission: EM | Admit: 2015-04-30 | Discharge: 2015-04-30 | Disposition: A | Discharge status: Home / Self Care | Payer: PRIVATE HEALTH INSURANCE | Attending: Emergency Medicine | Admitting: Emergency Medicine

## 2015-04-30 ENCOUNTER — Emergency Department
Admission: EM | Admit: 2015-04-30 | Discharge: 2015-04-30 | Disposition: A | Discharge status: Home Health | Payer: PRIVATE HEALTH INSURANCE

## 2015-04-30 DIAGNOSIS — J029 Acute pharyngitis, unspecified: Secondary | ICD-10-CM | POA: Diagnosis present

## 2015-04-30 DIAGNOSIS — E0500 Thyrotoxicosis with diffuse goiter without thyrotoxic crisis or storm: Secondary | ICD-10-CM | POA: Diagnosis not present

## 2015-04-30 DIAGNOSIS — J020 Streptococcal pharyngitis: Secondary | ICD-10-CM | POA: Diagnosis not present

## 2015-04-30 LAB — POCT RAPID STREP A: Streptococcus, Group A Screen (Direct): POSITIVE — AB

## 2015-04-30 MED ORDER — AMOXICILLIN 500 MG PO CAPS: 500.0000 mg | ORAL_CAPSULE | Freq: Three times a day (TID) | ORAL | Status: DC

## 2015-04-30 MED ORDER — AMOXICILLIN 500 MG PO CAPS
500.0000 mg | ORAL_CAPSULE | Freq: Once | ORAL | Status: AC
  Administered 2015-04-30: 500 mg via ORAL
  Filled 2015-04-30: qty 1

## 2015-04-30 NOTE — Discharge Instructions (Signed)
Tylenol or ibuprofen as needed for fever and body aches. Take amoxicillin 500 mg 3 times a day for 10 days. Do not stop taking the medication until completely finished. Drink fluids if he did not feel like eating. Follow-up with Dr. Sherrie Mustache if any continued problems.

## 2015-04-30 NOTE — ED Notes (Signed)
Pt reports sore throat x 3 days.  

## 2015-04-30 NOTE — ED Provider Notes (Signed)
[facility] Emergency Department Provider Note  ____________________________________________  Time seen: Approximately 7:27 AM  I have reviewed the triage vital signs and the nursing notes.   HISTORY  Chief Complaint Sore Throat    HPI Edwin Haney is a 20 y.o. male is here with complaint of sore throat 3 days.Patient was uncertain that he was running fever. He has not taken any Tylenol or Motrin today but did yesterday. He states it is painful to swallow pills but he has done so without any difficulty. Currently he rates his pain as a 10 over 10.   Past Medical History  Diagnosis Date  . Graves disease     There are no active problems to display for this patient.   History reviewed. No pertinent past surgical history.  Current Outpatient Rx  Name  Route  Sig  Dispense  Refill  . amoxicillin (AMOXIL) 500 MG capsule   Oral   Take 1 capsule (500 mg total) by mouth 3 (three) times daily.   30 capsule   0     Allergies Review of patient's allergies indicates no known allergies.  No family history on file.  Social History Social History  Substance Use Topics  . Smoking status: Never Smoker   . Smokeless tobacco: None  . Alcohol Use: None    Review of Systems Constitutional: No fever/Positive chills ENT: Positive sore throat. Cardiovascular: Denies chest pain. Respiratory: Denies shortness of breath. Gastrointestinal: No abdominal pain.  No nausea, no vomiting.  Skin: Negative for rash. Neurological: Positive for headaches  10-point ROS otherwise negative.  ____________________________________________   PHYSICAL EXAM:  VITAL SIGNS: ED Triage Vitals  Enc Vitals Group     BP 04/30/15 0723 138/81 mmHg     Pulse Rate 04/30/15 0723 102     Resp 04/30/15 0723 17     Temp 04/30/15 0723 99.1 F (37.3 C)     Temp Source 04/30/15 0723 Oral     SpO2 04/30/15 0723 97 %     Weight 04/30/15 0723 190 lb (86.183 kg)     Height 04/30/15  0723 6' (1.829 m)     Head Cir --      Peak Flow --      Pain Score 04/30/15 0724 10     Pain Loc --      Pain Edu? --      Excl. in GC? --     Constitutional: Alert and oriented. Well appearing and in no acute distress. Eyes: Conjunctivae are normal. PERRL. EOMI. Head: Atraumatic. Nose: No congestion/rhinnorhea.   EACs and TMs are clear bilaterally. Mouth/Throat: Mucous membranes are moist.  Oropharynx Erythematous without exudate. Neck: No stridor.   Hematological/Lymphatic/Immunilogical: Moderately tender bilateral cervical lymphadenopathy Cardiovascular: Normal rate, regular rhythm. Grossly normal heart sounds.  Good peripheral circulation. Respiratory: Normal respiratory effort.  No retractions. Lungs CTAB. Musculoskeletal: Moves upper and lower extremities without difficulty and normal gait was noted. Neurologic:  Normal speech and language. No gross focal neurologic deficits are appreciated. No gait instability. Skin:  Skin is warm, dry and intact. No rash noted. Psychiatric: Mood and affect are normal. Speech and behavior are normal.  ____________________________________________   LABS (all labs ordered are listed, but only abnormal results are displayed)  Labs Reviewed - No data to display  PROCEDURES  Procedure(s) performed: None  Critical Care performed: No  ____________________________________________   INITIAL IMPRESSION / ASSESSMENT AND PLAN / ED COURSE  Pertinent labs & imaging results that were available  during my care of the patient were reviewed by me and considered in my medical decision making (see chart for details).  Patient started on amoxicillin 500 mg 3 times a day for 10 days. He is continue taking Tylenol and ibuprofen as needed for fever. He is also given a note to remain out of work. He is to follow-up with Dr. Sherrie Mustache if any continued problems. ____________________________________________   FINAL CLINICAL IMPRESSION(S) / ED  DIAGNOSES  Final diagnoses:  Pharyngitis, streptococcal, acute      Tommi Rumps, PA-C 04/30/15 [id number]  Sharman Cheek, MD 04/30/15 [phone]

## 2015-05-02 ENCOUNTER — Encounter: Payer: Self-pay | Admitting: Family Medicine

## 2015-05-02 ENCOUNTER — Ambulatory Visit (INDEPENDENT_AMBULATORY_CARE_PROVIDER_SITE_OTHER): Payer: PRIVATE HEALTH INSURANCE | Admitting: Family Medicine

## 2015-05-02 VITALS — BP 102/70 | HR 95 | Temp 99.5°F | Resp 16 | Wt 183.0 lb

## 2015-05-02 DIAGNOSIS — J020 Streptococcal pharyngitis: Secondary | ICD-10-CM

## 2015-05-02 DIAGNOSIS — E0500 Thyrotoxicosis with diffuse goiter without thyrotoxic crisis or storm: Secondary | ICD-10-CM | POA: Insufficient documentation

## 2015-05-02 MED ORDER — AMOXICILLIN 250 MG/5ML PO SUSR: 500.0000 mg | Freq: Three times a day (TID) | ORAL | Status: AC

## 2015-05-02 NOTE — Patient Instructions (Signed)
You may take 4 (four)  200mg  ibuprofen tablets every eight hours to help with throat pain

## 2015-05-02 NOTE — Progress Notes (Signed)
       Patient: Edwin Haney Male    DOB: 03/31/1995   20 y.o.   MRN: [id number] Visit Date: 05/02/2015  Today's Provider: Mila Merry, MD   Chief Complaint  Patient presents with  . Hospitalization Follow-up   Subjective:    HPI   Follow up ER visit  Patient was seen in ER for [facility] on 04/30/2015. He was treated for sore throat and congestion. Treatment for this included; tested positive for strep. Started on amoxicillin 500 mg tid x10 days. Advised to continue tylenol and ibuprofen prn for fever. Remain out of work note was given tp patient. To follow-up with provider if any continued problems. Having trouble swallowing amoxicillin due to throat pain and swelling of tonsils. He would like to change to liquid antiobiotic.  No Known Allergies Previous Medications   ALBUTEROL (PROAIR HFA) 108 (90 BASE) MCG/ACT INHALER    Inhale 2 puffs into the lungs. Reported on 05/02/2015   AMOXICILLIN (AMOXIL) 500 MG CAPSULE    Take 1 capsule (500 mg total) by mouth 3 (three) times daily.   LEVOTHYROXINE (SYNTHROID, LEVOTHROID) 50 MCG TABLET    Take 1 tablet by mouth daily.    Review of Systems  Constitutional: Negative for fever, chills and appetite change.  Respiratory: Negative for chest tightness, shortness of breath and wheezing.   Cardiovascular: Negative for chest pain and palpitations.  Gastrointestinal: Negative for nausea, vomiting and abdominal pain.    Social History  Substance Use Topics  . Smoking status: Never Smoker   . Smokeless tobacco: Not on file  . Alcohol Use: No   Objective:   BP 102/70 mmHg  Pulse 95  Temp(Src) 99.5 F (37.5 C) (Oral)  Resp 16  Wt 183 lb (83.008 kg)  SpO2 95%  Physical Exam  General Appearance:    Alert, cooperative, no distress  HENT:   bilateral TM normal without fluid or infection, neck has bilateral anterior cervical nodes enlarged, tonsils red, enlarged, with exudate present and sinuses nontender  Eyes:    PERRL, conjunctiva/corneas  clear, EOM's intact       Lungs:     Clear to auscultation bilaterally, respirations unlabored  Heart:    Regular rate and rhythm  Neurologic:   Awake, alert, oriented x 3. No apparent focal neurological           defect.   Skin:    No Rashes       Assessment & Plan:     1. Strep throat Change to liquid amoxicillin suspension and get on scheduled OTC ibuprofen - amoxicillin (AMOXIL) 250 MG/5ML suspension; Take 10 mLs (500 mg total) by mouth 3 (three) times daily.  Dispense: 200 mL; Refill: 0   Patient Instructions  You may take 4 (four)  200mg  ibuprofen tablets every eight hours to help with throat pain         Mila Merry, MD  [facility]

## 2015-05-03 ENCOUNTER — Encounter: Payer: Self-pay | Admitting: Family Medicine

## 2015-07-09 ENCOUNTER — Other Ambulatory Visit: Payer: Self-pay | Admitting: Internal Medicine

## 2015-07-09 DIAGNOSIS — E0500 Thyrotoxicosis with diffuse goiter without thyrotoxic crisis or storm: Secondary | ICD-10-CM

## 2015-08-08 ENCOUNTER — Ambulatory Visit
Admission: RE | Admit: 2015-08-08 | Discharge: 2015-08-08 | Disposition: A | Discharge status: Home / Self Care | Payer: PRIVATE HEALTH INSURANCE | Source: Ambulatory Visit | Attending: Internal Medicine | Admitting: Internal Medicine

## 2015-08-08 ENCOUNTER — Encounter
Admission: RE | Admit: 2015-08-08 | Discharge: 2015-08-08 | Disposition: A | Discharge status: Home / Self Care | Payer: PRIVATE HEALTH INSURANCE | Source: Ambulatory Visit | Attending: Internal Medicine | Admitting: Internal Medicine

## 2015-08-08 DIAGNOSIS — E0500 Thyrotoxicosis with diffuse goiter without thyrotoxic crisis or storm: Secondary | ICD-10-CM | POA: Insufficient documentation

## 2015-08-08 MED ORDER — SODIUM IODIDE I-123 3.7 MBQ PO CAPS
161.4500 | ORAL_CAPSULE | Freq: Once | ORAL | Status: AC
  Administered 2015-08-08: 161.45 via ORAL

## 2015-08-09 ENCOUNTER — Encounter
Admission: RE | Admit: 2015-08-09 | Discharge: 2015-08-09 | Disposition: A | Discharge status: Home / Self Care | Payer: PRIVATE HEALTH INSURANCE | Source: Ambulatory Visit | Attending: Internal Medicine | Admitting: Internal Medicine

## 2015-08-10 ENCOUNTER — Encounter
Admission: RE | Admit: 2015-08-10 | Discharge: 2015-08-10 | Disposition: A | Discharge status: Home / Self Care | Payer: PRIVATE HEALTH INSURANCE | Source: Ambulatory Visit | Attending: Internal Medicine | Admitting: Internal Medicine

## 2015-08-10 DIAGNOSIS — E0500 Thyrotoxicosis with diffuse goiter without thyrotoxic crisis or storm: Secondary | ICD-10-CM

## 2015-08-10 MED ORDER — SODIUM IODIDE I 131 CAPSULE
21.4000 | Freq: Once | INTRAVENOUS | Status: AC | PRN
  Administered 2015-08-10: 21.4 via ORAL

## 2017-03-09 IMAGING — NM NM THYROID IMAGING W/ UPTAKE MULTI (4&24 HR)
1 series · 3 of 3 positions shown · non-contrast
Comparison: Thyroid uptake and scan 08/23/2014

CLINICAL DATA: Hyperthyroidism. Previous I 131 therapy for [place]
Persistent depressed TSH equal less than 0.015 (07/03/2015).

EXAM:
THYROID SCAN AND UPTAKE - 4 AND 24 HOURS
TECHNIQUE: Following oral administration of I 123 capsule, anterior planar
imaging was acquired at 24 hours. Thyroid uptake was calculated with
a thyroid probe at 4-6 hours and 24 hours.
RADIOPHARMACEUTICALS:  One hundred sixty-one uCi I -123

[Series 1000: (id) thyroid scan · 2.40mm/px · 3 of 3 slices shown]
[im 1/3  full-range]
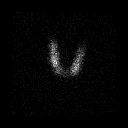
[im 2/3  full-range]
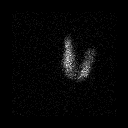
[im 3/3  full-range]
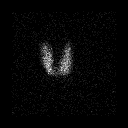

[3 of 3 positions shown; findings below may reference images not displayed]

FINDINGS: Insert 4 uptake within thyroid gland without nodularity. The volume
of the gland is reduced from comparison exam..

6 hour I 123 uptake = 10.2% (normal 5-20%) (previously 82% pre I 131
therapy)

24 hour I 123 uptake = 24.3% (normal 10-30%) (previously 86% pre I
131 therapy)
IMPRESSION: 1. Uniform uptake within thyroid gland which is reduced in volume
following initial I 131 ablation.
2. Normal I 123 uptake.

## 2017-04-25 ENCOUNTER — Encounter: Payer: Self-pay | Admitting: Emergency Medicine

## 2017-04-25 ENCOUNTER — Emergency Department
Admission: EM | Admit: 2017-04-25 | Discharge: 2017-04-25 | Disposition: A | Discharge status: Home / Self Care | Payer: PRIVATE HEALTH INSURANCE | Attending: Emergency Medicine | Admitting: Emergency Medicine

## 2017-04-25 ENCOUNTER — Other Ambulatory Visit: Payer: Self-pay

## 2017-04-25 DIAGNOSIS — M436 Torticollis: Secondary | ICD-10-CM | POA: Diagnosis not present

## 2017-04-25 DIAGNOSIS — M542 Cervicalgia: Secondary | ICD-10-CM | POA: Diagnosis present

## 2017-04-25 DIAGNOSIS — Z79899 Other long term (current) drug therapy: Secondary | ICD-10-CM | POA: Diagnosis not present

## 2017-04-25 MED ORDER — MELOXICAM 15 MG PO TABS: 15.0000 mg | ORAL_TABLET | Freq: Every day | ORAL | 0 refills | Status: AC

## 2017-04-25 MED ORDER — METHOCARBAMOL 500 MG PO TABS: 500.0000 mg | ORAL_TABLET | Freq: Four times a day (QID) | ORAL | 0 refills | Status: AC

## 2017-04-25 MED ORDER — KETOROLAC TROMETHAMINE 30 MG/ML IJ SOLN
30.0000 mg | Freq: Once | INTRAMUSCULAR | Status: AC
  Administered 2017-04-25: 30 mg via INTRAMUSCULAR
  Filled 2017-04-25: qty 1

## 2017-04-25 MED ORDER — ORPHENADRINE CITRATE 30 MG/ML IJ SOLN
60.0000 mg | Freq: Once | INTRAMUSCULAR | Status: AC
  Administered 2017-04-25: 60 mg via INTRAMUSCULAR
  Filled 2017-04-25: qty 2

## 2017-04-25 NOTE — ED Provider Notes (Signed)
[facility] Emergency Department Provider Note  ____________________________________________  Time seen: Approximately 3:32 PM  I have reviewed the triage vital signs and the nursing notes.   HISTORY  Chief Complaint Neck Pain    HPI Edwin Haney is a 22 y.o. male who presents the emergency department complaining of right-sided neck pain and spasms.  Patient reports that he does a lot of heavy lifting at work, experienced similar symptoms to the left side of his neck approximately 2 weeks ago that resolved on its own.  Patient awoke yesterday morning with a "stiff and painful "right side of his neck.  Patient reports that it is a tight sensation.  No headache, visual changes, fevers or chills, URI symptoms, numbness and tingling in any extremity.  No recent trauma.  Patient is not tried medications at home for this complaint.  Patient reports that he does drink a lot of caffeine, however he also drinks plenty of water and Gatorade.  No other complaints at this time.  Patient with a history of Graves' disease.  Also with a history of cardiomyopathy.  Patient denies any complaints with chronic medical problems at this time.  Past Medical History:  Diagnosis Date  . Graves disease   . History of chicken pox     Patient Active Problem List   Diagnosis Date Noted  . 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 Cardiomyopathy, nonischemic (HCC) 11/11/2013  . Hyperthyroidism 10/23/2013    History reviewed. No pertinent surgical history.  Prior to Admission medications   Medication Sig Start Date End Date Taking? Authorizing Provider  albuterol (PROAIR HFA) 108 (90 Base) MCG/ACT inhaler Inhale 2 puffs into the lungs. Reported on 05/02/2015 08/01/13   [provider]  amoxicillin (AMOXIL) 250 MG/5ML suspension Take 10 mLs (500 mg total) by mouth 3 (three) times daily. 05/02/15   Malva Limes, MD  levothyroxine (SYNTHROID, LEVOTHROID) 50 MCG tablet Take  1 tablet by mouth daily. 02/28/15 02/28/16  [provider]  meloxicam (MOBIC) 15 MG tablet Take 1 tablet (15 mg total) by mouth daily. 04/25/17   Ziquan Fidel, Delorise Royals, PA-C  methocarbamol (ROBAXIN) 500 MG tablet Take 1 tablet (500 mg total) by mouth 4 (four) times daily. 04/25/17   Zuri Bradway, Delorise Royals, PA-C    Allergies Patient has no known allergies.  Family History  Problem Relation Age of Onset  . Stroke Maternal Grandmother     Social History Social History   Tobacco Use  . Smoking status: Never Smoker  Substance Use Topics  . Alcohol use: No    Alcohol/week: 0.0 oz  . Drug use: No     Review of Systems  Constitutional: No fever/chills Eyes: No visual changes.  ENT: No upper respiratory complaints. Cardiovascular: no chest pain. Respiratory: no cough. No SOB. Gastrointestinal: No abdominal pain.  No nausea, no vomiting.  No diarrhea.  No constipation. Genitourinary: Negative for dysuria. No hematuria Musculoskeletal: Positive for right-sided neck pain Skin: Negative for rash, abrasions, lacerations, ecchymosis. Neurological: Negative for headaches, focal weakness or numbness. 10-point ROS otherwise negative.  ____________________________________________   PHYSICAL EXAM:  VITAL SIGNS: ED Triage Vitals  Enc Vitals Group     BP 04/25/17 1403 (!) 145/83     Pulse Rate 04/25/17 1403 (!) 58     Resp 04/25/17 1403 20     Temp 04/25/17 1403 98.2 F (36.8 C)     Temp Source 04/25/17 1403 Oral     SpO2 04/25/17 1403 99 %  Weight 04/25/17 1405 205 lb (93 kg)     Height 04/25/17 1405 5\' 10"  (1.778 m)     Head Circumference --      Peak Flow --      Pain Score 04/25/17 1405 10     Pain Loc --      Pain Edu? --      Excl. in GC? --      Constitutional: Alert and oriented. Well appearing and in no acute distress. Eyes: Conjunctivae are normal. PERRL. EOMI. Head: Atraumatic. ENT:      Ears:       Nose: No congestion/rhinnorhea.      Mouth/Throat:  Mucous membranes are moist.  Neck: No stridor.  No midline cervical spine tenderness to palpation.  No palpable abnormality or step-off to the cervical spine.  Patient is diffusely tender to palpation in the right paraspinal and trapezius muscle groups.  Palpable spasms are appreciated.  Patient with decreased range of motion to rotation of the cervical spine to the left, very limited range of motion to the right.  Decreased range of motion to both flexion and extension.  Negative Kernig's and Brudzinski's.  Radial pulse intact bilateral upper extremities.  Sensation intact and equal in all dermatomal distributions bilateral upper extremities. Hematological/Lymphatic/Immunilogical: No cervical lymphadenopathy. Cardiovascular: Normal rate, regular rhythm. Normal S1 and S2.  Good peripheral circulation. Respiratory: Normal respiratory effort without tachypnea or retractions. Lungs CTAB. Good air entry to the bases with no decreased or absent breath sounds. Musculoskeletal: Full range of motion to all extremities. No gross deformities appreciated. Neurologic:  Normal speech and language. No gross focal neurologic deficits are appreciated.  Cranial nerves II through XII grossly intact. Skin:  Skin is warm, dry and intact. No rash noted. Psychiatric: Mood and affect are normal. Speech and behavior are normal. Patient exhibits appropriate insight and judgement.   ____________________________________________   LABS (all labs ordered are listed, but only abnormal results are displayed)  Labs Reviewed - No data to display ____________________________________________  EKG   ____________________________________________  RADIOLOGY   No results found.  ____________________________________________    PROCEDURES  Procedure(s) performed:    Procedures    Medications  orphenadrine (NORFLEX) injection 60 mg (60 mg Intramuscular Given 04/25/17 1541)  ketorolac (TORADOL) 30 MG/ML injection 30  mg (30 mg Intramuscular Given 04/25/17 1537)     ____________________________________________   INITIAL IMPRESSION / ASSESSMENT AND PLAN / ED COURSE  Pertinent labs & imaging results that were available during my care of the patient were reviewed by me and considered in my medical decision making (see chart for details).  Review of the [place] CSRS was performed in accordance of the [facility] prior to dispensing any controlled drugs.     Patient's diagnosis is consistent with torticollis.  Patient presented with right-sided neck pain and stiffness.  Initial differential included cervical strain, torticollis, meningitis.  At this time, exam is reassuring.  Patient is given Toradol and Norflex in the emergency department.  No indication for labs or imaging at this time.  Patient will be discharged home with prescriptions for meloxicam and Robaxin for symptom control. Patient is to follow up with primary care as needed or otherwise directed. Patient is given ED precautions to return to the ED for any worsening or new symptoms.     ____________________________________________  FINAL CLINICAL IMPRESSION(S) / ED DIAGNOSES  Final diagnoses:  Torticollis, acute      NEW MEDICATIONS STARTED DURING THIS VISIT:  ED Discharge Orders  Ordered    meloxicam (MOBIC) 15 MG tablet  Daily     04/25/17 1539    methocarbamol (ROBAXIN) 500 MG tablet  4 times daily     04/25/17 1539          This chart was dictated using voice recognition software/Dragon. Despite best efforts to proofread, errors can occur which can change the meaning. Any change was purely unintentional.    Racheal Patches, PA-C 04/25/17 1546    Minna Antis, MD 04/25/17 2317

## 2017-04-25 NOTE — ED Triage Notes (Signed)
States L neck pain began a couple weeks ago, yesterday started R sided spasm. No fall or injury.
